# Patient Record
Sex: Female | Born: 1995 | Race: Black or African American | Hispanic: No | Marital: Single | Smoking: Never smoker
Health system: Southern US, Community
[De-identification: ages and names within clinical notes are randomized; demographics above are authoritative.]

## PROBLEM LIST (undated history)

## (undated) ENCOUNTER — Inpatient Hospital Stay (HOSPITAL_COMMUNITY): Payer: Self-pay

## (undated) DIAGNOSIS — Z789 Other specified health status: Secondary | ICD-10-CM

## (undated) HISTORY — PX: NO PAST SURGERIES: SHX2092

---

## 2015-06-07 NOTE — L&D Delivery Note (Signed)
Delivery Note Pt became complete quickly and pushed with 1 ctx and at 2107 a viable female was delivered via NSVD (presentation: LOA ;  ).  APGAR: 9 ,10 ; weight pending  .   Placenta status: Delivered intact via Tomasa BlaseSchultz , .  Cord: 3 vessel with the following complications: none .  Anesthesia:  none Episiotomy:  n/a Lacerations:  none Est. Blood Loss (mL):  150  Mom to postpartum.  Baby to Couplet care / Skin to Skin.  Clearance Cootsndrew Tyson 05/22/2016, 9:20 PM   Patient is a G3P1011 at 2348w1d who was admitted with SROM/SOL, uncomplicated prenatal course.  She progressed without augmentation.  I was gloved and present for delivery in its entirety.  Second stage of labor progressed, baby delivered after one contraction.  No decels during second stage noted.  Complications: none  Lacerations: none  EBL: 150cc  Cam HaiSHAW, KIMBERLY, CNM 9:32 PM  05/22/2016

## 2015-10-15 LAB — OB RESULTS CONSOLE HIV ANTIBODY (ROUTINE TESTING): HIV: NONREACTIVE

## 2015-10-15 LAB — OB RESULTS CONSOLE ANTIBODY SCREEN: ANTIBODY SCREEN: NEGATIVE

## 2015-10-15 LAB — OB RESULTS CONSOLE HEPATITIS B SURFACE ANTIGEN: HEP B S AG: NEGATIVE

## 2015-10-15 LAB — OB RESULTS CONSOLE ABO/RH: RH Type: POSITIVE

## 2015-10-15 LAB — OB RESULTS CONSOLE RPR: RPR: NONREACTIVE

## 2015-10-15 LAB — OB RESULTS CONSOLE RUBELLA ANTIBODY, IGM: RUBELLA: IMMUNE

## 2015-12-09 LAB — OB RESULTS CONSOLE GC/CHLAMYDIA
Chlamydia: NEGATIVE
GC PROBE AMP, GENITAL: NEGATIVE

## 2016-04-25 LAB — OB RESULTS CONSOLE GBS: STREP GROUP B AG: NEGATIVE

## 2016-04-30 ENCOUNTER — Encounter (HOSPITAL_COMMUNITY): Payer: Self-pay | Admitting: Certified Nurse Midwife

## 2016-04-30 ENCOUNTER — Inpatient Hospital Stay (HOSPITAL_COMMUNITY)
Admission: AD | Admit: 2016-04-30 | Discharge: 2016-04-30 | Disposition: A | Discharge status: Home / Self Care | Payer: Medicaid Other | Source: Ambulatory Visit | Attending: Obstetrics and Gynecology | Admitting: Obstetrics and Gynecology

## 2016-04-30 ENCOUNTER — Inpatient Hospital Stay (HOSPITAL_COMMUNITY): Payer: Medicaid Other

## 2016-04-30 DIAGNOSIS — O368130 Decreased fetal movements, third trimester, not applicable or unspecified: Secondary | ICD-10-CM

## 2016-04-30 DIAGNOSIS — O368390 Maternal care for abnormalities of the fetal heart rate or rhythm, unspecified trimester, not applicable or unspecified: Secondary | ICD-10-CM

## 2016-04-30 DIAGNOSIS — R109 Unspecified abdominal pain: Secondary | ICD-10-CM | POA: Insufficient documentation

## 2016-04-30 DIAGNOSIS — N858 Other specified noninflammatory disorders of uterus: Secondary | ICD-10-CM

## 2016-04-30 DIAGNOSIS — Z3A36 36 weeks gestation of pregnancy: Secondary | ICD-10-CM | POA: Insufficient documentation

## 2016-04-30 DIAGNOSIS — N859 Noninflammatory disorder of uterus, unspecified: Secondary | ICD-10-CM

## 2016-04-30 DIAGNOSIS — O26893 Other specified pregnancy related conditions, third trimester: Secondary | ICD-10-CM | POA: Diagnosis not present

## 2016-04-30 HISTORY — DX: Other specified health status: Z78.9

## 2016-04-30 NOTE — MAU Provider Note (Signed)
Chief Complaint:  Abdominal Pain and Decreased Fetal Movement   First Provider Initiated Contact with Patient 04/30/16 1837     HPI: Jasmin HowellsLadoris Philyaw is a 20 y.o. G3P1011 at 4036w0dwho presents to maternity admissions reporting decreased fetal movement today. ALso has some cramping in lower abdomen. . She denies LOF, vaginal bleeding, vaginal itching/burning, urinary symptoms, h/a, dizziness, n/v, diarrhea, constipation or fever/chills.  She denies headache, visual changes or RUQ abdominal pain.  Other  This is a new problem. The current episode started today. The problem occurs constantly. The problem has been unchanged. Associated symptoms include abdominal pain (cramping). Pertinent negatives include no chills, congestion, fatigue, fever, nausea or vomiting. Nothing aggravates the symptoms. She has tried nothing for the symptoms.    Past Medical History: Past Medical History:  Diagnosis Date  . Medical history non-contributory     Past obstetric history: OB History  Gravida Para Term Preterm AB Living  3 1 1   1 1   SAB TAB Ectopic Multiple Live Births  1       1    # Outcome Date GA Lbr Len/2nd Weight Sex Delivery Anes PTL Lv  3 Current           2 Term 06/03/15    M Vag-Spont   LIV  1 SAB 2014              Past Surgical History: Past Surgical History:  Procedure Laterality Date  . NO PAST SURGERIES      Family History: History reviewed. No pertinent family history.  Social History: Social History  Substance Use Topics  . Smoking status: Never Smoker  . Smokeless tobacco: Never Used  . Alcohol use No    Allergies: No Known Allergies  Meds:  Prescriptions Prior to Admission  Medication Sig Dispense Refill Last Dose  . Prenatal Vit-Fe Fumarate-FA (MULTIVITAMIN-PRENATAL) 27-0.8 MG TABS tablet Take 1 tablet by mouth daily at 12 noon.   04/30/2016 at Unknown time    I have reviewed patient's Past Medical Hx, Surgical Hx, Family Hx, Social Hx, medications and  allergies.   ROS:  Review of Systems  Constitutional: Negative for chills, fatigue and fever.  HENT: Negative for congestion.   Gastrointestinal: Positive for abdominal pain (cramping). Negative for nausea and vomiting.  Genitourinary: Positive for pelvic pain. Negative for vaginal bleeding and vaginal discharge.   Other systems negative  Physical Exam  Patient Vitals for the past 24 hrs:  BP Temp Temp src Pulse Resp Height Weight  04/30/16 1632 98/71 98 F (36.7 C) Oral (!) 122 18 5\' 2"  (1.575 m) 191 lb (86.6 kg)   Constitutional: Well-developed, well-nourished female in no acute distress.  Cardiovascular: normal rate and rhythm Respiratory: normal effort, clear to auscultation bilaterally GI: Abd soft, non-tender, gravid appropriate for gestational age.   No rebound or guarding. MS: Extremities nontender, no edema, normal ROM Neurologic: Alert and oriented x 4.  GU: Neg CVAT.  Dilation: 1.5 Effacement (%): 70 Cervical Position: Middle Station: -3 Presentation: Vertex Exam by:: Mayford KnifeWilliams  FHT:  Baseline 140 , moderate variability, accelerations present, one variable deceleration to 100 for one minute Contractions: irritability   Labs: No results found for this or any previous visit (from the past 24 hour(s)).    Imaging:  BPP 8/8 AFI 12  MAU Course/MDM: I have ordered labs and reviewed results.  NST reviewed   BPP and AFI ordered  >> normal and reassuring Consult Dr Emelda FearFerguson with presentation, exam findings and test  results.  Treatments in MAU included NST.    Assessment: SIUP at 2883w0d Decreased fetal movement with reassuring BPP and AFI Fetal heart rate decel x 1  Uterine irritability  Plan: Discharge home Labor precautions and fetal kick counts Follow up in Office for prenatal visits and recheck of fetal status  Encouraged to return here or to other Urgent Care/ED if she develops worsening of symptoms, increase in pain, fever, or other concerning  symptoms.     Pt stable at time of discharge.  Wynelle BourgeoisMarie Jamaine Quintin CNM, MSN Certified Nurse-Midwife 04/30/2016 6:38 PM

## 2016-04-30 NOTE — MAU Note (Signed)
Pt states she has pain all over her body since early afternoon. Pt states she hasn't felt the baby move since this AM. Pt denies ctxs, LOF or vaginal bleeding.

## 2016-04-30 NOTE — Discharge Instructions (Signed)
Braxton Hicks Contractions °Contractions of the uterus can occur throughout pregnancy. Contractions are not always a sign that you are in labor.  °WHAT ARE BRAXTON HICKS CONTRACTIONS?  °Contractions that occur before labor are called Braxton Hicks contractions, or false labor. Toward the end of pregnancy (32-34 weeks), these contractions can develop more often and may become more forceful. This is not true labor because these contractions do not result in opening (dilatation) and thinning of the cervix. They are sometimes difficult to tell apart from true labor because these contractions can be forceful and people have different pain tolerances. You should not feel embarrassed if you go to the hospital with false labor. Sometimes, the only way to tell if you are in true labor is for your health care provider to look for changes in the cervix. °If there are no prenatal problems or other health problems associated with the pregnancy, it is completely safe to be sent home with false labor and await the onset of true labor. °HOW CAN YOU TELL THE DIFFERENCE BETWEEN TRUE AND FALSE LABOR? °False Labor  °· The contractions of false labor are usually shorter and not as hard as those of true labor.   °· The contractions are usually irregular.   °· The contractions are often felt in the front of the lower abdomen and in the groin.   °· The contractions may go away when you walk around or change positions while lying down.   °· The contractions get weaker and are shorter lasting as time goes on.   °· The contractions do not usually become progressively stronger, regular, and closer together as with true labor.   °True Labor  °· Contractions in true labor last 30-70 seconds, become very regular, usually become more intense, and increase in frequency.   °· The contractions do not go away with walking.   °· The discomfort is usually felt in the top of the uterus and spreads to the lower abdomen and low back.   °· True labor can be  determined by your health care provider with an exam. This will show that the cervix is dilating and getting thinner.   °WHAT TO REMEMBER °· Keep up with your usual exercises and follow other instructions given by your health care provider.   °· Take medicines as directed by your health care provider.   °· Keep your regular prenatal appointments.   °· Eat and drink lightly if you think you are going into labor.   °· If Braxton Hicks contractions are making you uncomfortable:   °¨ Change your position from lying down or resting to walking, or from walking to resting.   °¨ Sit and rest in a tub of warm water.   °¨ Drink 2-3 glasses of water. Dehydration may cause these contractions.   °¨ Do slow and deep breathing several times an hour.   °WHEN SHOULD I SEEK IMMEDIATE MEDICAL CARE? °Seek immediate medical care if: °· Your contractions become stronger, more regular, and closer together.   °· You have fluid leaking or gushing from your vagina.   °· You have a fever.   °· You pass blood-tinged mucus.   °· You have vaginal bleeding.   °· You have continuous abdominal pain.   °· You have low back pain that you never had before.   °· You feel your baby's head pushing down and causing pelvic pressure.   °· Your baby is not moving as much as it used to.   °This information is not intended to replace advice given to you by your health care provider. Make sure you discuss any questions you have with your health care   provider. Document Released: 05/23/2005 Document Revised: 09/14/2015 Document Reviewed: 03/04/2013 Elsevier Interactive Patient Education  2017 [employer] Fetal Monitoring, Biophysical Profile You may have special tests during pregnancy to make sure that your developing baby (fetus) is healthy. These tests are called fetal monitoring. A biophysical profile is one type of fetal monitoring. You may have a biophysical profile if other types of fetal monitoring show that your baby may be at above-normal risk for  certain problems. A biophysical profile is usually done during the last 3 months of pregnancy (third trimester). A biophysical profile combines two tests to check the health of your baby. In one test, you will have a device strapped to your belly to measure your baby's heart rate. This is a nonstress test. The other test involves using sound waves and a computer (ultrasound) to create an image of your baby inside your womb (uterus). Together, these tests tell your health care provider about the overall health of your baby. LET [facility] LtdYOUR HEALTH CARE PROVIDER KNOW ABOUT:   Any allergies you have.   All medicines you are taking, including vitamins, herbs, eye drops, creams, and over-the-counter medicines.   Previous problems you or members of your family have had with the use of anesthetics.   Any blood disorders you have.   Any surgeries you have had.   Medical conditions you have.  RISKS AND COMPLICATIONS  There are no risks to you or your baby from a biophysical profile.  BEFORE THE PROCEDURE  Ask your health care provider how to prepare.   You may need to drink fluids so that you have a full bladder for your ultrasound.  You may also need to eat before you arrive for the test. That makes your baby more active. PROCEDURE The nonstress test part of the procedure may take 20-30 minutes. The ultrasound part may take up to 1 hour. You will be awake during the procedure. You do not need medicine or anesthesia.  You will lie back.  A belt will be placed around your belly. The belt has a sensor to measure your baby's heart rate.  If your baby is asleep and not moving around, a buzzer may be used to wake your baby.  You may have to wear another belt and sensor to measure any muscle movements (contractions) in your uterus.  During the ultrasound, a health care provider or technician will gently roll a handheld device (transducer) over your belly. This device sends signals to a computer that  creates images of your baby.  Five areas of your baby's health and development are checked during the biophysical profile:  Heart rate.  Breathing.  Movement.  Active muscle movement (muscle tone).  The amount of fluid in your uterus (amniotic fluid). AFTER THE PROCEDURE   Your health care provider will discuss your results with you. The results of a biophysical profile are scored in a range from 0 to 10. If you get a score less than 4, you may need further testing, or your baby may need to be delivered early. A score of 8 to 10 is considered good.  Unless you need additional testing, you can go home right after the procedure and resume your normal activities. This information is not intended to replace advice given to you by your health care provider. Make sure you discuss any questions you have with your health care provider. Document Released: 05/13/2002 Document Revised: 05/28/2013 Document Reviewed: 03/15/2013 Elsevier Interactive Patient Education  2017 [employer]

## 2016-05-16 ENCOUNTER — Encounter (HOSPITAL_COMMUNITY): Payer: Self-pay | Admitting: *Deleted

## 2016-05-16 ENCOUNTER — Inpatient Hospital Stay (HOSPITAL_COMMUNITY)
Admission: AD | Admit: 2016-05-16 | Discharge: 2016-05-16 | Disposition: A | Discharge status: Home / Self Care | Payer: Medicaid Other | Source: Ambulatory Visit | Attending: Family Medicine | Admitting: Family Medicine

## 2016-05-16 DIAGNOSIS — O368130 Decreased fetal movements, third trimester, not applicable or unspecified: Secondary | ICD-10-CM | POA: Diagnosis not present

## 2016-05-16 DIAGNOSIS — O9A213 Injury, poisoning and certain other consequences of external causes complicating pregnancy, third trimester: Secondary | ICD-10-CM | POA: Diagnosis not present

## 2016-05-16 DIAGNOSIS — W108XXA Fall (on) (from) other stairs and steps, initial encounter: Secondary | ICD-10-CM | POA: Insufficient documentation

## 2016-05-16 DIAGNOSIS — W109XXA Fall (on) (from) unspecified stairs and steps, initial encounter: Secondary | ICD-10-CM | POA: Diagnosis not present

## 2016-05-16 DIAGNOSIS — T1490XA Injury, unspecified, initial encounter: Secondary | ICD-10-CM | POA: Diagnosis not present

## 2016-05-16 DIAGNOSIS — Z3A38 38 weeks gestation of pregnancy: Secondary | ICD-10-CM | POA: Diagnosis not present

## 2016-05-16 NOTE — MAU Provider Note (Signed)
  History     CSN: [id number]  Arrival date and time: 05/16/16 1331   First Provider Initiated Contact with Patient 05/16/16 1448      Chief Complaint  Patient presents with  . Fall  . Decreased Fetal Movement   HPI   [name] is a 20 y.o. female 853P1011 @ 5963w2d here in MAU following a fall that occurred this morning around 0500 when she was taking out the trash. She was going down the stairs and slipped. She reports that her leg went down first and then she slid down 7 stairs on her side. She did not land on her belly at any point. She ended up on her left side. She did not notice any bruising after the fall, she states that she felt sore after.   + fetal movement since arrival Denies leaking fluid or vaginal bleeding.    OB History    Gravida Para Term Preterm AB Living   3 1 1   1 1    SAB TAB Ectopic Multiple Live Births   1       1      Past Medical History:  Diagnosis Date  . Medical history non-contributory     Past Surgical History:  Procedure Laterality Date  . NO PAST SURGERIES      History reviewed. No pertinent family history.  Social History  Substance Use Topics  . Smoking status: Never Smoker  . Smokeless tobacco: Never Used  . Alcohol use No    Allergies: No Known Allergies  Prescriptions Prior to Admission  Medication Sig Dispense Refill Last Dose  . Prenatal Vit-Fe Fumarate-FA (MULTIVITAMIN-PRENATAL) 27-0.8 MG TABS tablet Take 1 tablet by mouth daily at 12 noon.   04/30/2016 at Unknown time   No results found for this or any previous visit (from the past 2160 hour(s)).  Review of Systems  Constitutional: Negative for chills and fever.  Gastrointestinal: Negative for abdominal pain.  Genitourinary:       Denies vaginal bleeding   Physical Exam   Blood pressure 100/64, pulse 93, temperature 99.3 F (37.4 C), temperature source Oral, resp. rate 18.  Physical Exam  Constitutional: She is oriented to person, place, and time. She  appears well-developed and well-nourished. No distress.  HENT:  Head: Normocephalic.  Eyes: Pupils are equal, round, and reactive to light.  GI: Soft. She exhibits no distension. There is no tenderness. There is no rebound and no guarding.  Musculoskeletal: Normal range of motion.  Neurological: She is alert and oriented to person, place, and time.  Skin: Skin is warm. She is not diaphoretic.  Psychiatric: Her behavior is normal.   Fetal Tracing: Baseline: 135 bpm Variability: moderate  Accelerations: 15x15 Decelerations: One deceleration at 1615 lasting 80 secs, back to baseline with moderate variability following decel.  Toco:none  MAU Course  Procedures  None  MDM  Fetal monitoring X 4 hours Discussed fetal tracing with Dr. Adrian BlackwaterStinson @ 1740  Assessment and Plan   A:  1. Fall (on) (from) other stairs and steps, initial encounter     P:  Discharge home in stable condition Strict return precautions Kick counts Labor precautions   Duane LopeJennifer I Rasch, NP 05/16/2016 5:47 PM

## 2016-05-16 NOTE — MAU Note (Signed)
Slipped and slid down about 7 steps this morning. Has not felt baby move all morning.  Having pain in lower abd

## 2016-05-16 NOTE — Discharge Instructions (Signed)
Fall Prevention in the Home Introduction Falls can cause injuries. They can happen to people of all ages. There are many things you can do to make your home safe and to help prevent falls. What can I do on the outside of my home?  Regularly fix the edges of walkways and driveways and fix any cracks.  Remove anything that might make you trip as you walk through a door, such as a raised step or threshold.  Trim any bushes or trees on the path to your home.  Use bright outdoor lighting.  Clear any walking paths of anything that might make someone trip, such as rocks or tools.  Regularly check to see if handrails are loose or broken. Make sure that both sides of any steps have handrails.  Any raised decks and porches should have guardrails on the edges.  Have any leaves, snow, or ice cleared regularly.  Use sand or salt on walking paths during winter.  Clean up any spills in your garage right away. This includes oil or grease spills. What can I do in the bathroom?  Use night lights.  Install grab bars by the toilet and in the tub and shower. Do not use towel bars as grab bars.  Use non-skid mats or decals in the tub or shower.  If you need to sit down in the shower, use a plastic, non-slip stool.  Keep the floor dry. Clean up any water that spills on the floor as soon as it happens.  Remove soap buildup in the tub or shower regularly.  Attach bath mats securely with double-sided non-slip rug tape.  Do not have throw rugs and other things on the floor that can make you trip. What can I do in the bedroom?  Use night lights.  Make sure that you have a light by your bed that is easy to reach.  Do not use any sheets or blankets that are too big for your bed. They should not hang down onto the floor.  Have a firm chair that has side arms. You can use this for support while you get dressed.  Do not have throw rugs and other things on the floor that can make you trip. What can  I do in the kitchen?  Clean up any spills right away.  Avoid walking on wet floors.  Keep items that you use a lot in easy-to-reach places.  If you need to reach something above you, use a strong step stool that has a grab bar.  Keep electrical cords out of the way.  Do not use floor polish or wax that makes floors slippery. If you must use wax, use non-skid floor wax.  Do not have throw rugs and other things on the floor that can make you trip. What can I do with my stairs?  Do not leave any items on the stairs.  Make sure that there are handrails on both sides of the stairs and use them. Fix handrails that are broken or loose. Make sure that handrails are as long as the stairways.  Check any carpeting to make sure that it is firmly attached to the stairs. Fix any carpet that is loose or worn.  Avoid having throw rugs at the top or bottom of the stairs. If you do have throw rugs, attach them to the floor with carpet tape.  Make sure that you have a light switch at the top of the stairs and the bottom of the stairs. If you   do not have them, ask someone to add them for you. What else can I do to help prevent falls?  Wear shoes that:  Do not have high heels.  Have rubber bottoms.  Are comfortable and fit you well.  Are closed at the toe. Do not wear sandals.  If you use a stepladder:  Make sure that it is fully opened. Do not climb a closed stepladder.  Make sure that both sides of the stepladder are locked into place.  Ask someone to hold it for you, if possible.  Clearly mark and make sure that you can see:  Any grab bars or handrails.  First and last steps.  Where the edge of each step is.  Use tools that help you move around (mobility aids) if they are needed. These include:  Canes.  Walkers.  Scooters.  Crutches.  Turn on the lights when you go into a dark area. Replace any light bulbs as soon as they burn out.  Set up your furniture so you have a  clear path. Avoid moving your furniture around.  If any of your floors are uneven, fix them.  If there are any pets around you, be aware of where they are.  Review your medicines with your doctor. Some medicines can make you feel dizzy. This can increase your chance of falling. Ask your doctor what other things that you can do to help prevent falls. This information is not intended to replace advice given to you by your health care provider. Make sure you discuss any questions you have with your health care provider. Document Released: 03/19/2009 Document Revised: 10/29/2015 Document Reviewed: 06/27/2014  2017 Elsevier  

## 2016-05-22 ENCOUNTER — Inpatient Hospital Stay (HOSPITAL_COMMUNITY)
Admission: AD | Admit: 2016-05-22 | Discharge: 2016-05-24 | DRG: 775 | Disposition: A | Discharge status: Home / Self Care | Payer: Medicaid Other | Source: Ambulatory Visit | Attending: Obstetrics and Gynecology | Admitting: Obstetrics and Gynecology

## 2016-05-22 ENCOUNTER — Encounter (HOSPITAL_COMMUNITY): Payer: Self-pay | Admitting: *Deleted

## 2016-05-22 ENCOUNTER — Inpatient Hospital Stay (HOSPITAL_COMMUNITY): Payer: Medicaid Other | Admitting: Anesthesiology

## 2016-05-22 DIAGNOSIS — Z3A39 39 weeks gestation of pregnancy: Secondary | ICD-10-CM | POA: Diagnosis not present

## 2016-05-22 DIAGNOSIS — O4202 Full-term premature rupture of membranes, onset of labor within 24 hours of rupture: Principal | ICD-10-CM | POA: Diagnosis present

## 2016-05-22 DIAGNOSIS — Z3493 Encounter for supervision of normal pregnancy, unspecified, third trimester: Secondary | ICD-10-CM | POA: Diagnosis present

## 2016-05-22 LAB — CBC
HCT: 35.2 % — ABNORMAL LOW (ref 36.0–46.0)
HEMOGLOBIN: 11.9 g/dL — AB (ref 12.0–15.0)
MCH: 30.4 pg (ref 26.0–34.0)
MCHC: 33.8 g/dL (ref 30.0–36.0)
MCV: 90 fL (ref 78.0–100.0)
PLATELETS: 241 10*3/uL (ref 150–400)
RBC: 3.91 MIL/uL (ref 3.87–5.11)
RDW: 14.1 % (ref 11.5–15.5)
WBC: 11 10*3/uL — ABNORMAL HIGH (ref 4.0–10.5)

## 2016-05-22 LAB — TYPE AND SCREEN
ABO/RH(D): B POS
ANTIBODY SCREEN: NEGATIVE

## 2016-05-22 LAB — ABO/RH: ABO/RH(D): B POS

## 2016-05-22 MED ORDER — LIDOCAINE HCL (PF) 1 % IJ SOLN
30.0000 mL | INTRAMUSCULAR | Status: DC | PRN
  Filled 2016-05-22: qty 30

## 2016-05-22 MED ORDER — IBUPROFEN 600 MG PO TABS
600.0000 mg | ORAL_TABLET | Freq: Four times a day (QID) | ORAL | Status: DC
  Administered 2016-05-22 – 2016-05-24 (×7): 600 mg via ORAL
  Filled 2016-05-22 (×7): qty 1

## 2016-05-22 MED ORDER — PHENYLEPHRINE 40 MCG/ML (10ML) SYRINGE FOR IV PUSH (FOR BLOOD PRESSURE SUPPORT)
80.0000 ug | PREFILLED_SYRINGE | INTRAVENOUS | Status: DC | PRN
  Filled 2016-05-22: qty 5

## 2016-05-22 MED ORDER — OXYTOCIN BOLUS FROM INFUSION
500.0000 mL | Freq: Once | INTRAVENOUS | Status: AC
  Administered 2016-05-22: 500 mL via INTRAVENOUS

## 2016-05-22 MED ORDER — HYDROXYZINE HCL 50 MG PO TABS
50.0000 mg | ORAL_TABLET | Freq: Four times a day (QID) | ORAL | Status: DC | PRN
  Filled 2016-05-22: qty 1

## 2016-05-22 MED ORDER — DIPHENHYDRAMINE HCL 50 MG/ML IJ SOLN: 12.5000 mg | INTRAMUSCULAR | Status: DC | PRN

## 2016-05-22 MED ORDER — ACETAMINOPHEN 325 MG PO TABS: 650.0000 mg | ORAL_TABLET | ORAL | Status: DC | PRN

## 2016-05-22 MED ORDER — SODIUM CHLORIDE 0.9% FLUSH: 3.0000 mL | Freq: Two times a day (BID) | INTRAVENOUS | Status: DC

## 2016-05-22 MED ORDER — EPHEDRINE 5 MG/ML INJ
10.0000 mg | INTRAVENOUS | Status: DC | PRN
  Filled 2016-05-22: qty 4

## 2016-05-22 MED ORDER — FENTANYL 2.5 MCG/ML BUPIVACAINE 1/10 % EPIDURAL INFUSION (WH - ANES)
INTRAMUSCULAR | Status: AC
  Filled 2016-05-22: qty 100

## 2016-05-22 MED ORDER — OXYTOCIN 40 UNITS IN LACTATED RINGERS INFUSION - SIMPLE MED
2.5000 [IU]/h | INTRAVENOUS | Status: DC
  Administered 2016-05-22: 2.5 [IU]/h via INTRAVENOUS
  Filled 2016-05-22 (×2): qty 1000

## 2016-05-22 MED ORDER — LACTATED RINGERS IV SOLN: INTRAVENOUS | Status: DC

## 2016-05-22 MED ORDER — SODIUM CHLORIDE 0.9% FLUSH: 3.0000 mL | INTRAVENOUS | Status: DC | PRN

## 2016-05-22 MED ORDER — SODIUM CHLORIDE 0.9 % IV SOLN: 250.0000 mL | INTRAVENOUS | Status: DC | PRN

## 2016-05-22 MED ORDER — OXYCODONE-ACETAMINOPHEN 5-325 MG PO TABS: 1.0000 | ORAL_TABLET | ORAL | Status: DC | PRN

## 2016-05-22 MED ORDER — OXYTOCIN 10 UNIT/ML IJ SOLN: 10.0000 [IU] | Freq: Once | INTRAMUSCULAR | Status: DC

## 2016-05-22 MED ORDER — FENTANYL 2.5 MCG/ML BUPIVACAINE 1/10 % EPIDURAL INFUSION (WH - ANES)
14.0000 mL/h | INTRAMUSCULAR | Status: DC | PRN
Start: 2016-05-22 — End: 2016-05-23

## 2016-05-22 MED ORDER — FENTANYL CITRATE (PF) 100 MCG/2ML IJ SOLN: 50.0000 ug | INTRAMUSCULAR | Status: DC | PRN

## 2016-05-22 MED ORDER — OXYCODONE-ACETAMINOPHEN 5-325 MG PO TABS: 2.0000 | ORAL_TABLET | ORAL | Status: DC | PRN

## 2016-05-22 MED ORDER — LACTATED RINGERS IV SOLN: 500.0000 mL | INTRAVENOUS | Status: DC | PRN

## 2016-05-22 MED ORDER — LACTATED RINGERS IV SOLN: 500.0000 mL | Freq: Once | INTRAVENOUS | Status: DC

## 2016-05-22 MED ORDER — ONDANSETRON HCL 4 MG/2ML IJ SOLN: 4.0000 mg | Freq: Four times a day (QID) | INTRAMUSCULAR | Status: DC | PRN

## 2016-05-22 MED ORDER — PHENYLEPHRINE 40 MCG/ML (10ML) SYRINGE FOR IV PUSH (FOR BLOOD PRESSURE SUPPORT)
PREFILLED_SYRINGE | INTRAVENOUS | Status: AC
  Filled 2016-05-22: qty 20

## 2016-05-22 NOTE — H&P (Signed)
Jasmin Best is a 20 y.o. female G3Ppresenting for active labor that started at 0400 this morning. GBS neg. OB History    Gravida Para Term Preterm AB Living   3 1 1   1 1    SAB TAB Ectopic Multiple Live Births   1       1     Past Medical History:  Diagnosis Date  . Medical history non-contributory    Past Surgical History:  Procedure Laterality Date  . NO PAST SURGERIES     Family History: family history is not on file. Social History:  reports that she has never smoked. She has never used smokeless tobacco. She reports that she does not drink alcohol or use drugs.     Maternal Diabetes: No Genetic Screening: Normal Maternal Ultrasounds/Referrals: Normal Fetal Ultrasounds or other Referrals:  None Maternal Substance Abuse:  No Significant Maternal Medications:  None Significant Maternal Lab Results:  None Other Comments:  None  Review of Systems  Constitutional: Negative.   Eyes: Negative.   Respiratory: Negative.   Cardiovascular: Negative.   Gastrointestinal: Positive for abdominal pain.  Genitourinary: Negative.   Musculoskeletal: Negative.   Skin: Negative.   Neurological: Negative.   Endo/Heme/Allergies: Negative.   Psychiatric/Behavioral: Negative.    Maternal Medical History:  Reason for admission: Rupture of membranes and contractions.   Contractions: Onset was 13-24 hours ago.   Frequency: regular.   Perceived severity is moderate.    Fetal activity: Perceived fetal activity is normal.   Last perceived fetal movement was within the past hour.    Prenatal complications: no prenatal complications Prenatal Complications - Diabetes: none.    Dilation: 7.5 Effacement (%): 90 Station: -1 Exam by:: Marlynn Perking. Lawson, CNM Blood pressure 97/70, pulse (!) 139, temperature 97.9 F (36.6 C), temperature source Oral, resp. rate 18, SpO2 99 %. Maternal Exam:  Uterine Assessment: Contraction strength is moderate.  Contraction frequency is regular.   Abdomen:  Patient reports no abdominal tenderness. Fetal presentation: vertex  Introitus: Normal vulva. Normal vagina.  Amniotic fluid character: clear.  Pelvis: adequate for delivery.   Cervix: Cervix evaluated by digital exam.     Fetal Exam Fetal Monitor Review: Mode: ultrasound.   Variability: moderate (6-25 bpm).   Pattern: no decelerations.    Fetal State Assessment: Category I - tracings are normal.     Physical Exam  Constitutional: She is oriented to person, place, and time. She appears well-developed and well-nourished.  HENT:  Head: Normocephalic.  Eyes: Pupils are equal, round, and reactive to light.  Neck: Normal range of motion.  Cardiovascular: Normal rate, regular rhythm, normal heart sounds and intact distal pulses.   Respiratory: Effort normal and breath sounds normal.  GI: Soft. Bowel sounds are normal.  Genitourinary: Vagina normal and uterus normal.  Musculoskeletal: Normal range of motion.  Neurological: She is alert and oriented to person, place, and time. She has normal reflexes.  Skin: Skin is warm and dry.  Psychiatric: She has a normal mood and affect. Her behavior is normal. Judgment and thought content normal.    Prenatal labs: ABO, Rh:   Antibody:   Rubella:   RPR:    HBsAg:    HIV:    GBS:     Assessment/Plan: Preg at 39.1 wks, Active labor SROM @ 1830 Admit. SVE 7-8/90/-1 vertex.   Wyvonnia DuskyMarie Lawson 05/22/2016, 6:35 PM

## 2016-05-22 NOTE — Anesthesia Preprocedure Evaluation (Signed)

## 2016-05-22 NOTE — MAU Note (Signed)
Started contracting at 0400, have gotten closer and stronger  Today.  Denies bleeding and leaking. Was 2 cm when last checked.  Denies problems with preg.  2nd preg.

## 2016-05-22 NOTE — MAU Note (Signed)
Pt reports having contractions since 0400 this morning and states they are closer together and stronger now.  Pt denies any bleeding or leaking.

## 2016-05-22 NOTE — Anesthesia Postprocedure Evaluation (Signed)
Anesthesia Post Note  Patient: Jasmin Best  Procedure(s) Performed: * No procedures listed *  Patient location during evaluation: L&D Pain management: pain level not controlled Cardiovascular status: stable Anesthetic complications: no Comments: Patient is about to deliver     Last Vitals:  Vitals:   05/22/16 1948 05/22/16 2001  BP: 109/61   Pulse: (!) 103 (!) 108  Resp:    Temp:      Last Pain:  Vitals:   05/22/16 2002  TempSrc:   PainSc: 9    Pain Goal: Patients Stated Pain Goal: 10 (05/22/16 2002)               Alianis Trimmer JR,JOHN Susann GivensFRANKLIN

## 2016-05-23 LAB — RPR: RPR Ser Ql: NONREACTIVE

## 2016-05-23 MED ORDER — SENNOSIDES-DOCUSATE SODIUM 8.6-50 MG PO TABS
2.0000 | ORAL_TABLET | ORAL | Status: DC
  Administered 2016-05-24: 2 via ORAL
  Filled 2016-05-23: qty 2

## 2016-05-23 MED ORDER — DIBUCAINE 1 % RE OINT: 1.0000 "application " | TOPICAL_OINTMENT | RECTAL | Status: DC | PRN

## 2016-05-23 MED ORDER — OXYCODONE HCL 5 MG PO TABS
5.0000 mg | ORAL_TABLET | ORAL | Status: DC | PRN
  Administered 2016-05-23 (×3): 5 mg via ORAL
  Filled 2016-05-23 (×3): qty 1

## 2016-05-23 MED ORDER — DIPHENHYDRAMINE HCL 25 MG PO CAPS: 25.0000 mg | ORAL_CAPSULE | Freq: Four times a day (QID) | ORAL | Status: DC | PRN

## 2016-05-23 MED ORDER — COCONUT OIL OIL: 1.0000 "application " | TOPICAL_OIL | Status: DC | PRN

## 2016-05-23 MED ORDER — ONDANSETRON HCL 4 MG/2ML IJ SOLN: 4.0000 mg | INTRAMUSCULAR | Status: DC | PRN

## 2016-05-23 MED ORDER — ONDANSETRON HCL 4 MG PO TABS: 4.0000 mg | ORAL_TABLET | ORAL | Status: DC | PRN

## 2016-05-23 MED ORDER — ZOLPIDEM TARTRATE 5 MG PO TABS: 5.0000 mg | ORAL_TABLET | Freq: Every evening | ORAL | Status: DC | PRN

## 2016-05-23 MED ORDER — PRENATAL MULTIVITAMIN CH
1.0000 | ORAL_TABLET | Freq: Every day | ORAL | Status: DC
  Administered 2016-05-23 – 2016-05-24 (×2): 1 via ORAL
  Filled 2016-05-23 (×2): qty 1

## 2016-05-23 MED ORDER — WITCH HAZEL-GLYCERIN EX PADS: 1.0000 "application " | MEDICATED_PAD | CUTANEOUS | Status: DC | PRN

## 2016-05-23 MED ORDER — BENZOCAINE-MENTHOL 20-0.5 % EX AERO: 1.0000 "application " | INHALATION_SPRAY | CUTANEOUS | Status: DC | PRN

## 2016-05-23 MED ORDER — ACETAMINOPHEN 325 MG PO TABS: 650.0000 mg | ORAL_TABLET | ORAL | Status: DC | PRN

## 2016-05-23 MED ORDER — SIMETHICONE 80 MG PO CHEW: 80.0000 mg | CHEWABLE_TABLET | ORAL | Status: DC | PRN

## 2016-05-23 MED ORDER — TETANUS-DIPHTH-ACELL PERTUSSIS 5-2.5-18.5 LF-MCG/0.5 IM SUSP: 0.5000 mL | Freq: Once | INTRAMUSCULAR | Status: DC

## 2016-05-23 NOTE — Progress Notes (Signed)
Patient ID: Jasmin Best, female   DOB: May 20, 1996, 20 y.o.   MRN: [id number]  POSTPARTUM PROGRESS NOTE  Post Partum Day 1 Subjective:  Jasmin Best is a 20 y.o. [id number] 572w1d s/p NSVD.  No acute events overnight.  Pt denies problems with ambulating, voiding or po intake.  She denies nausea or vomiting.  Pain is moderately controlled.  She has had flatus. She has had bowel movement.  Lochia Minimal.   Objective: Blood pressure 126/60, pulse 96, temperature 98.7 F (37.1 C), temperature source Oral, resp. rate 18, height 5\' 2"  (1.575 m), weight 192 lb (87.1 kg), SpO2 100 %, unknown if currently breastfeeding.  Physical Exam:  General: alert, cooperative and no distress Lochia:normal flow Chest: CTAB Heart: RRR no m/r/g Abdomen: +BS, soft, nontender Uterine Fundus: firm DVT Evaluation: No calf swelling or tenderness Extremities: No edema   Recent Labs  05/22/16 1902  HGB 11.9*  HCT 35.2*    Assessment/Plan:  ASSESSMENT: Jasmin Best is a 20 y.o. [id number] 362w1d s/p NSVD after SOL.   Plan for discharge tomorrow   LOS: 1 day   Freddrick MarchYashika Amin, MD PGY-1 [facility]  05/23/2016, 7:25 AM   CNM attestation Post Partum Day #1 I have seen and examined this patient and agree with above documentation in the resident's note.   Jasmin Best is a 20 y.o. [id number] s/p SVD.  Pt denies problems with ambulating, voiding or po intake. Pain is well controlled.  Plan for birth control is Nexplanon.  Method of Feeding: bottle  PE:  BP 126/60   Pulse 96   Temp 98.7 F (37.1 C) (Oral)   Resp 18   Ht 5\' 2"  (1.575 m)   Wt 87.1 kg (192 lb)   SpO2 100%   Breastfeeding? Unknown   BMI 35.12 kg/m  Fundus firm  Plan for discharge: 05/24/16  Cam HaiSHAW, KIMBERLY, CNM 9:15 AM  05/23/2016

## 2016-05-24 MED ORDER — IBUPROFEN 600 MG PO TABS: 600.0000 mg | ORAL_TABLET | Freq: Four times a day (QID) | ORAL | 1 refills | Status: AC

## 2016-05-24 MED ORDER — OXYCODONE HCL 5 MG PO TABS: 5.0000 mg | ORAL_TABLET | ORAL | 0 refills | Status: AC | PRN

## 2016-05-24 MED ORDER — SENNOSIDES-DOCUSATE SODIUM 8.6-50 MG PO TABS: 2.0000 | ORAL_TABLET | ORAL | 1 refills | Status: AC

## 2016-05-24 NOTE — Discharge Summary (Signed)
Obstetric Discharge Summary Reason for Admission: onset of labor Prenatal Procedures: ultrasound Intrapartum Procedures: spontaneous vaginal delivery Postpartum Procedures: none Complications-Operative and Postpartum: none Hemoglobin  Date Value Ref Range Status  05/22/2016 11.9 (L) 12.0 - 15.0 g/dL Final   HCT  Date Value Ref Range Status  05/22/2016 35.2 (L) 36.0 - 46.0 % Final    Physical Exam:  General: alert, cooperative and no distress Lochia: appropriate Uterine Fundus: firm Incision: none DVT Evaluation: No evidence of DVT seen on physical exam. No cords or calf tenderness. No significant calf/ankle edema.  Discharge Diagnoses: Term Pregnancy-delivered  Discharge Information: Date: 05/24/2016 Activity: pelvic rest Diet: routine Medications: PNV, Ibuprofen, Colace and Percocet Condition: stable Instructions: refer to practice specific booklet Discharge to: home Follow-up Information    [facility]   Why:  postpartum exam 4-6 weeks with Nexplanon insertion.  Contact information: [address] [place] [address] [phone]           Newborn Data: Live born female  Birth Weight: 6 lb 15.3 oz (3155 g) APGAR: 9, 10  Home with mother.  Roe Coombsachelle A Ashlynn Gunnels, CNM 05/24/2016, 7:29 AM

## 2016-05-24 NOTE — Progress Notes (Signed)
Post Partum Day #2 Subjective: up ad lib, voiding, tolerating PO and reports cramping  Objective: Blood pressure 116/72, pulse 76, temperature 98.2 F (36.8 C), temperature source Oral, resp. rate 18, height 5\' 2"  (1.575 m), weight 192 lb (87.1 kg), SpO2 100 %, unknown if currently breastfeeding.  Physical Exam:  General: alert, cooperative and no distress Lochia: appropriate Uterine Fundus: firm Incision: none DVT Evaluation: No evidence of DVT seen on physical exam. No cords or calf tenderness. No significant calf/ankle edema.   Recent Labs  05/22/16 1902  HGB 11.9*  HCT 35.2*    Assessment/Plan: Discharge home and Contraception Nexplanon planned   LOS: 2 days   Roe CoombsRachelle A Denney, CNM 05/24/2016, 7:26 AM

## 2017-04-23 IMAGING — US US MFM FETAL BPP W/O NON-STRESS
1 series · 12 of 27 positions shown · non-contrast
Comparison: none

[Series 1: us mfm fetal bpp w/o non-stress · 27 acquisitions, 12 frames shown]
[im 2/27]
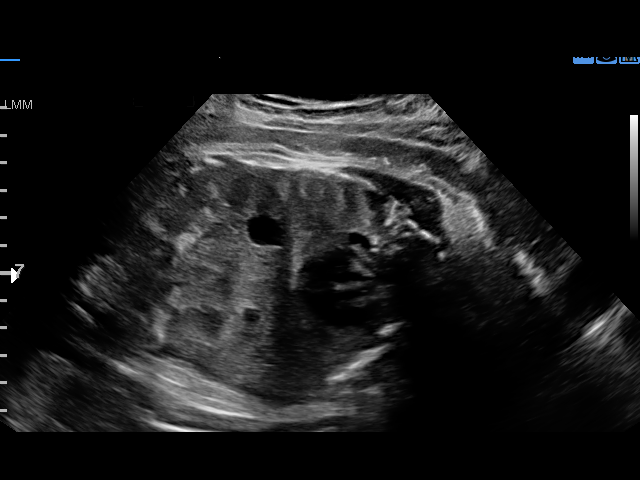
[im 4/27]
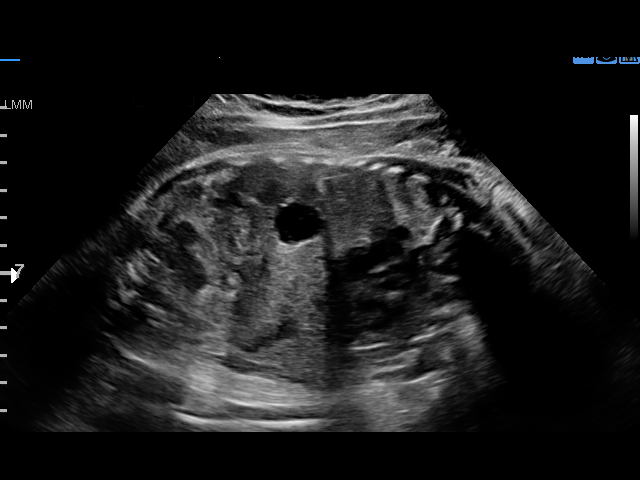
[im 6/27]
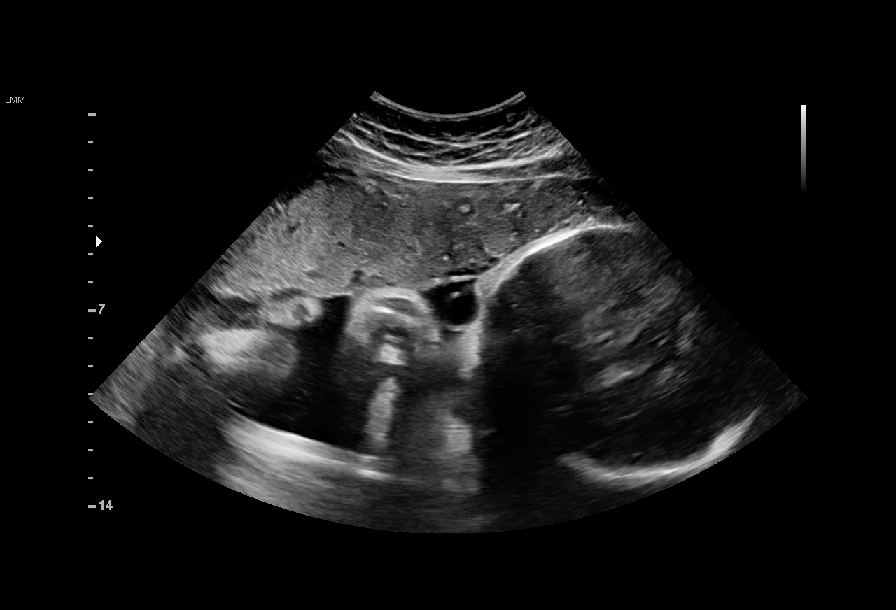
[im 8/27]
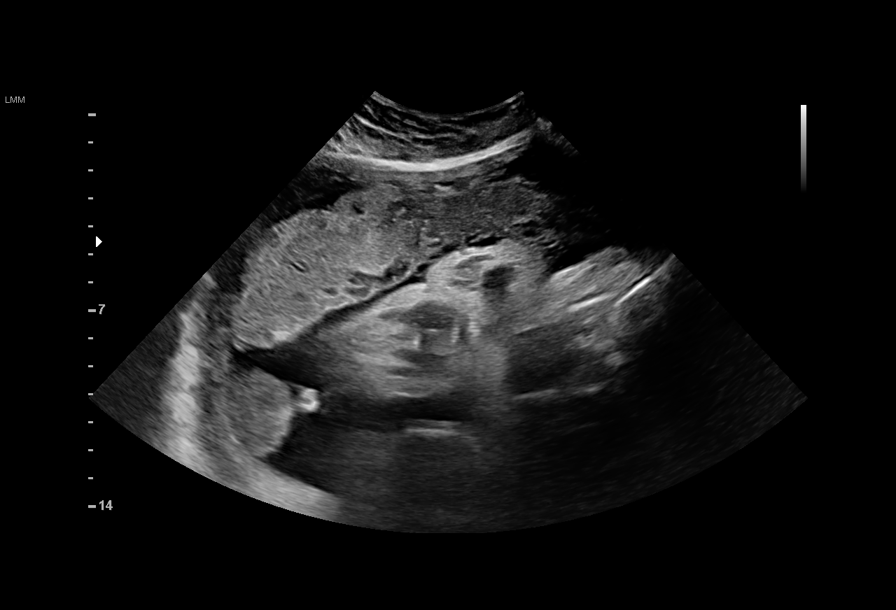
[im 11/27]
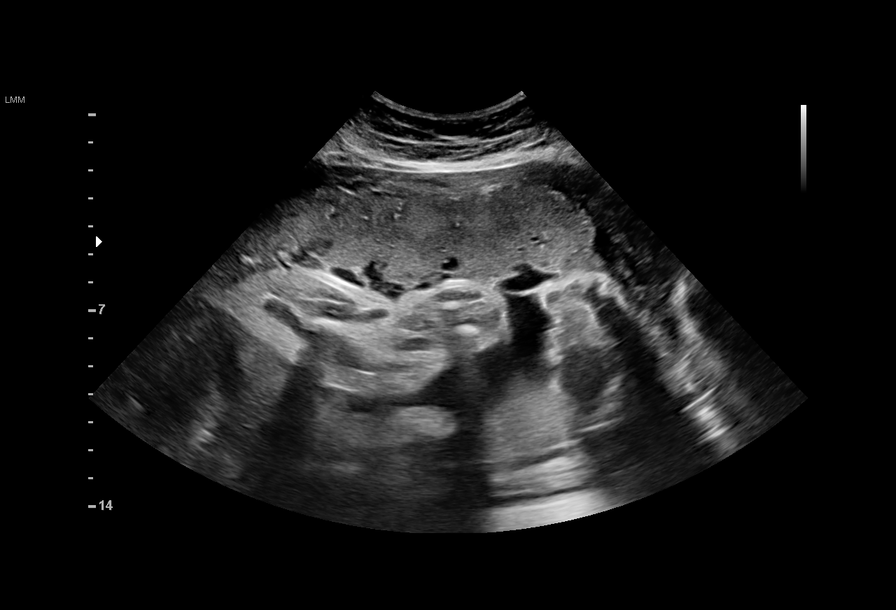
[im 13/27]
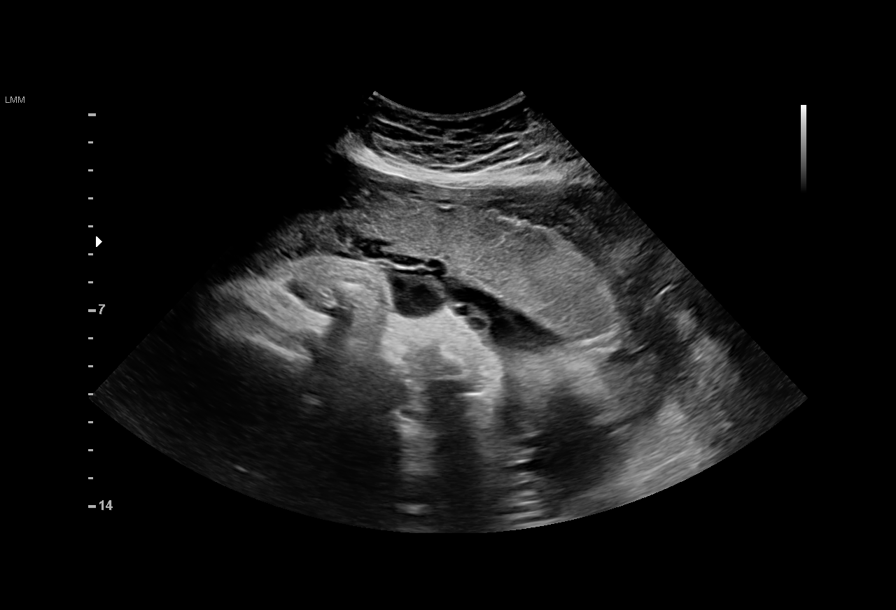
[im 15/27]
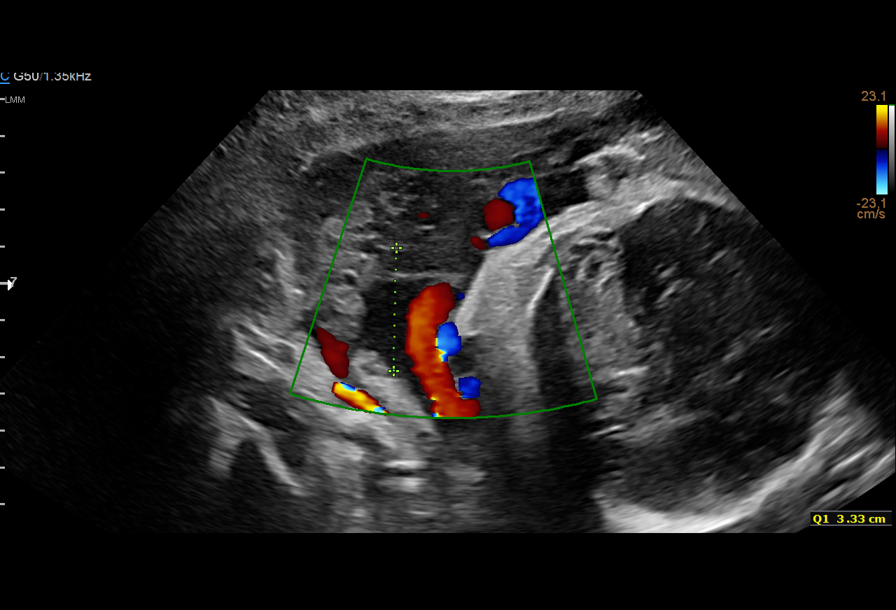
[im 17/27]
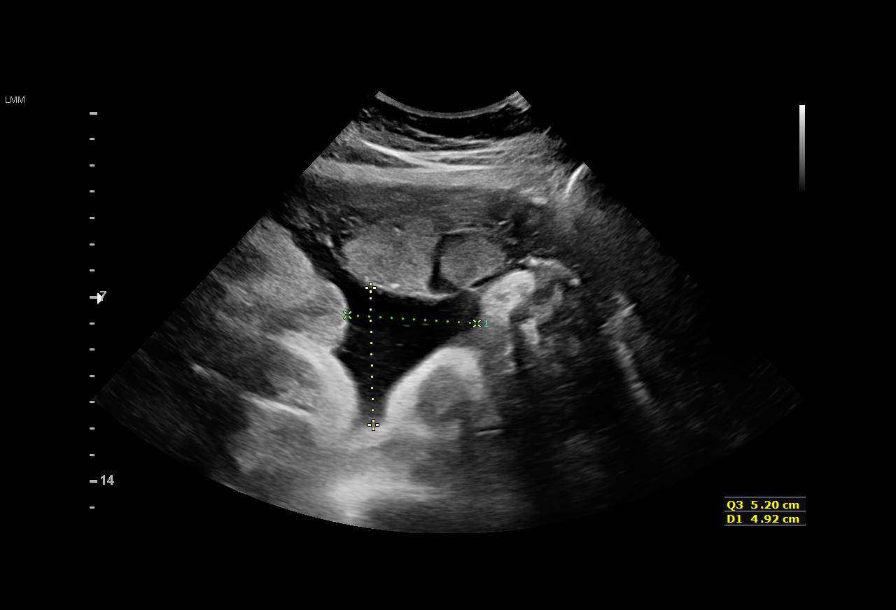
[im 20/27]
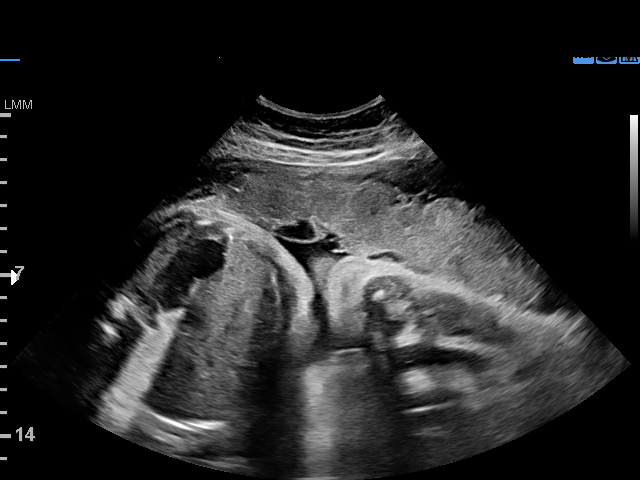
[im 22/27]
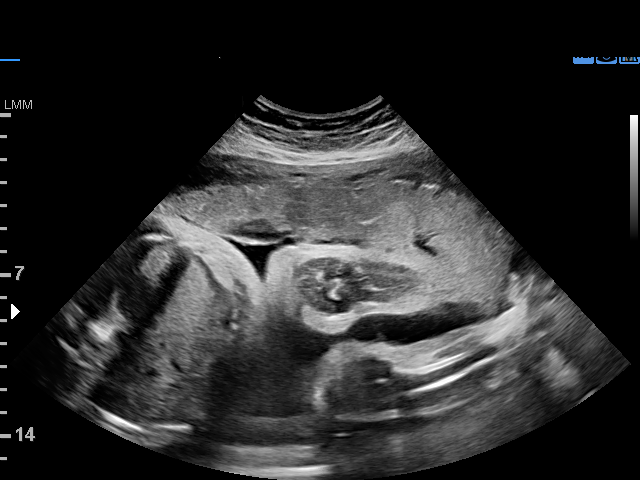
[im 24/27]
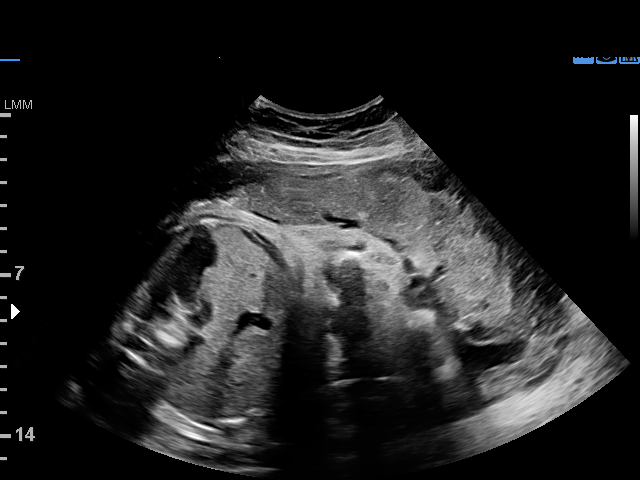
[im 26/27]
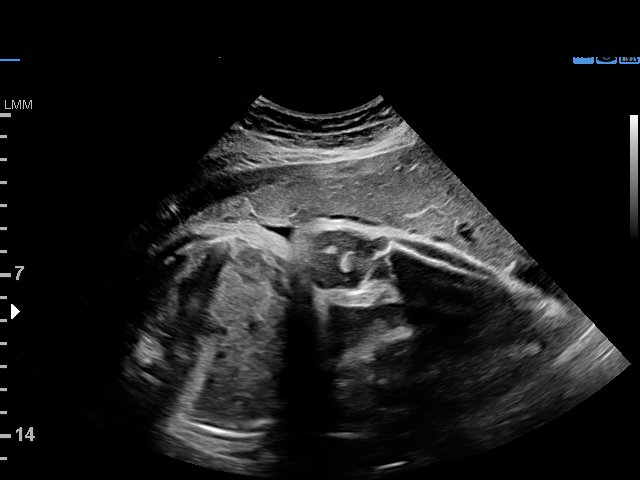

[12 of 27 positions shown; findings below may reference images not displayed]

[REDACTED]
Attending:        Byanda Ginya        Secondary Phy.:    JOCELYNE Nursing-
MAU/Triage

Indications

36 weeks gestation of pregnancy
Decreased fetal movements, third trimester,
unspecified
Variables on NST
OB History

Gravidity:    3         Term:   1         SAB:   1
Living:       1
Fetal Evaluation

Num Of Fetuses:     1
Fetal Heart         121
Rate(bpm):
Cardiac Activity:   Observed
Presentation:       Cephalic
Placenta:           Anterior, above cervical os

Amniotic Fluid
AFI FV:      Subjectively within normal limits

AFI Sum(cm)     %Tile       Largest Pocket(cm)
12.38           40

RUQ(cm)       RLQ(cm)       LUQ(cm)        LLQ(cm)
3.33
Biophysical Evaluation
Amniotic F.V:   Pocket => 2 cm two         F. Tone:         Observed
planes
F. Movement:    Observed                   Score:           [DATE]
F. Breathing:   Observed
Gestational Age

Clinical EDD:  36w 0d                                        EDD:   05/28/16
Best:          36w 0d     Det. By:  Clinical EDD             EDD:   05/28/16
Impression

Single IUP at 36w 0d
Cephalic presentation
BPP [DATE]
Normal amniotic fluid volume
Recommendations

Follow up ultrasounds as clinically indicated
